# Patient Record
Sex: Male | Born: 1985 | Race: Black or African American | Hispanic: No | Marital: Single | Smoking: Never smoker
Health system: Southern US, Community
[De-identification: ages and names within clinical notes are randomized; demographics above are authoritative.]

---
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
## 2004-01-15 ENCOUNTER — Emergency Department (HOSPITAL_COMMUNITY): Admission: AD | Admit: 2004-01-15 | Discharge: 2004-01-16 | Payer: Self-pay | Admitting: Emergency Medicine

## 2006-04-17 ENCOUNTER — Emergency Department (HOSPITAL_COMMUNITY): Admission: EM | Admit: 2006-04-17 | Discharge: 2006-04-17 | Payer: Self-pay | Admitting: Emergency Medicine

## 2006-07-20 ENCOUNTER — Ambulatory Visit (HOSPITAL_COMMUNITY): Admission: RE | Admit: 2006-07-20 | Discharge: 2006-07-20 | Payer: Self-pay | Admitting: Internal Medicine

## 2006-07-20 ENCOUNTER — Ambulatory Visit: Payer: Self-pay | Admitting: Internal Medicine

## 2006-09-15 ENCOUNTER — Ambulatory Visit: Payer: Self-pay | Admitting: Internal Medicine

## 2006-09-15 ENCOUNTER — Ambulatory Visit (HOSPITAL_COMMUNITY): Admission: RE | Admit: 2006-09-15 | Discharge: 2006-09-15 | Payer: Self-pay | Admitting: Internal Medicine

## 2006-09-15 ENCOUNTER — Ambulatory Visit: Payer: Self-pay | Admitting: *Deleted

## 2006-09-22 ENCOUNTER — Ambulatory Visit: Payer: Self-pay | Admitting: Family Medicine

## 2006-11-03 ENCOUNTER — Emergency Department (HOSPITAL_COMMUNITY): Admission: EM | Admit: 2006-11-03 | Discharge: 2006-11-03 | Payer: Self-pay | Admitting: Emergency Medicine

## 2007-07-22 ENCOUNTER — Emergency Department (HOSPITAL_COMMUNITY): Admission: EM | Admit: 2007-07-22 | Discharge: 2007-07-22 | Payer: Self-pay | Admitting: Emergency Medicine

## 2007-08-18 ENCOUNTER — Encounter (INDEPENDENT_AMBULATORY_CARE_PROVIDER_SITE_OTHER): Payer: Self-pay | Admitting: *Deleted

## 2008-02-24 ENCOUNTER — Emergency Department (HOSPITAL_COMMUNITY): Admission: EM | Admit: 2008-02-24 | Discharge: 2008-02-24 | Payer: Self-pay | Admitting: Emergency Medicine

## 2008-04-02 ENCOUNTER — Emergency Department (HOSPITAL_COMMUNITY): Admission: EM | Admit: 2008-04-02 | Discharge: 2008-04-02 | Payer: Self-pay | Admitting: Emergency Medicine

## 2008-08-30 ENCOUNTER — Emergency Department (HOSPITAL_COMMUNITY): Admission: EM | Admit: 2008-08-30 | Discharge: 2008-08-30 | Payer: Self-pay | Admitting: Family Medicine

## 2008-12-05 ENCOUNTER — Emergency Department (HOSPITAL_COMMUNITY): Admission: EM | Admit: 2008-12-05 | Discharge: 2008-12-05 | Payer: Self-pay | Admitting: Family Medicine

## 2009-01-19 ENCOUNTER — Emergency Department (HOSPITAL_COMMUNITY): Admission: EM | Admit: 2009-01-19 | Discharge: 2009-01-19 | Payer: Self-pay | Admitting: Emergency Medicine

## 2009-02-14 ENCOUNTER — Emergency Department (HOSPITAL_COMMUNITY): Admission: EM | Admit: 2009-02-14 | Discharge: 2009-02-14 | Payer: Self-pay | Admitting: Family Medicine

## 2009-04-10 ENCOUNTER — Emergency Department (HOSPITAL_COMMUNITY): Admission: EM | Admit: 2009-04-10 | Discharge: 2009-04-10 | Payer: Self-pay | Admitting: Family Medicine

## 2009-04-30 ENCOUNTER — Emergency Department (HOSPITAL_COMMUNITY): Admission: EM | Admit: 2009-04-30 | Discharge: 2009-04-30 | Payer: Self-pay | Admitting: Emergency Medicine

## 2009-06-08 ENCOUNTER — Emergency Department (HOSPITAL_COMMUNITY): Admission: EM | Admit: 2009-06-08 | Discharge: 2009-06-08 | Payer: Self-pay | Admitting: Emergency Medicine

## 2009-07-26 ENCOUNTER — Emergency Department (HOSPITAL_COMMUNITY): Admission: EM | Admit: 2009-07-26 | Discharge: 2009-07-26 | Payer: Self-pay | Admitting: Family Medicine

## 2009-12-10 ENCOUNTER — Emergency Department (HOSPITAL_COMMUNITY): Admission: EM | Admit: 2009-12-10 | Discharge: 2009-12-10 | Payer: Self-pay | Admitting: Emergency Medicine

## 2010-03-05 ENCOUNTER — Emergency Department (HOSPITAL_COMMUNITY): Admission: EM | Admit: 2010-03-05 | Discharge: 2010-03-05 | Payer: Self-pay | Admitting: Family Medicine

## 2010-07-29 ENCOUNTER — Emergency Department (HOSPITAL_COMMUNITY): Admission: EM | Admit: 2010-07-29 | Discharge: 2010-07-29 | Payer: Self-pay | Admitting: Family Medicine

## 2010-08-30 IMAGING — CR DG CHEST 2V
2 series · 2 of 2 positions shown · non-contrast
Comparison: 04/10/2009 and 02/24/2008.

CLINICAL DATA: Left chest and abdominal pain since yesterday.

CHEST - 2 VIEW

[w chest pa]
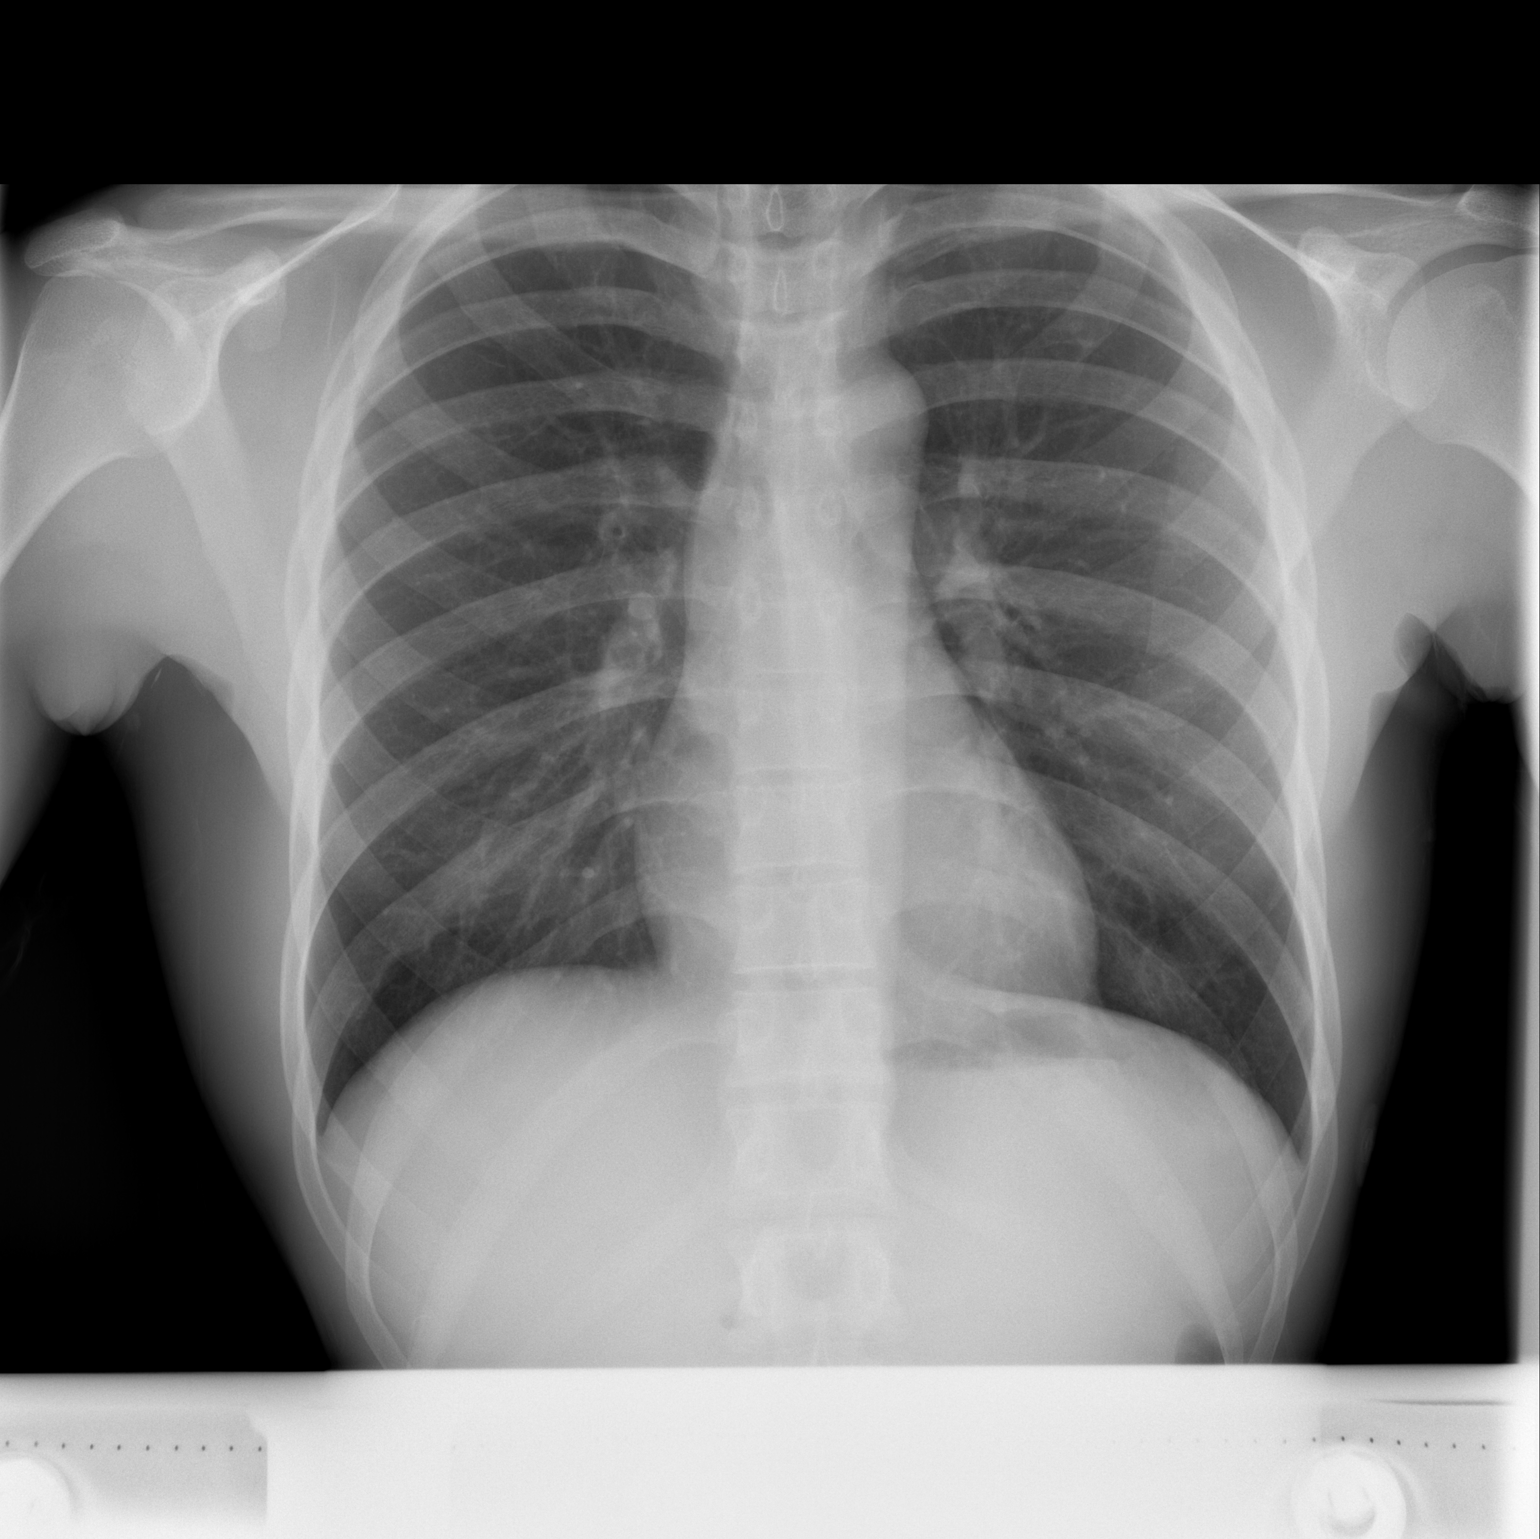

[w chest lat]
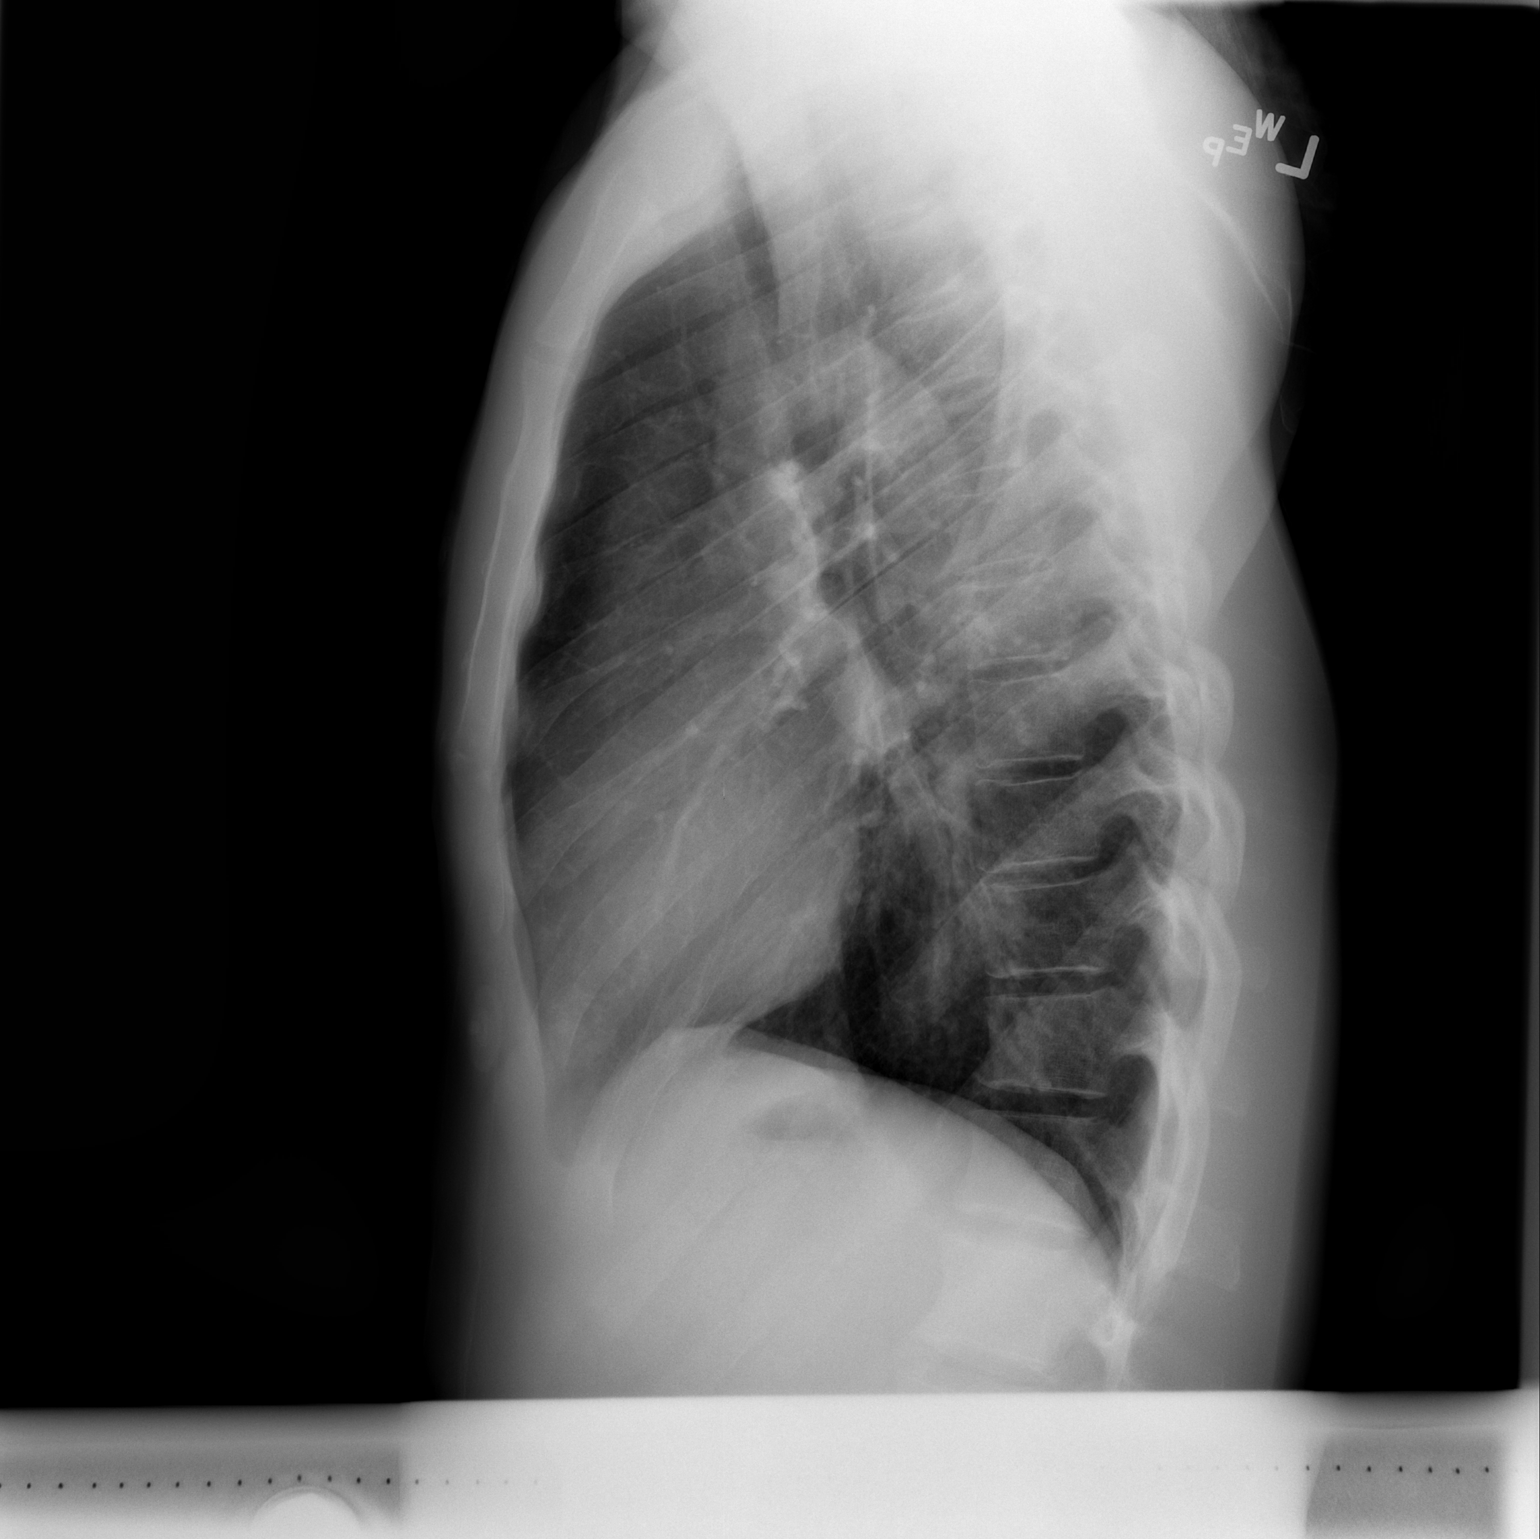

[2 of 2 positions shown; findings below may reference images not displayed]

FINDINGS: The heart size and mediastinal contours are stable.  The
lungs are clear.  There is no pleural effusion or pneumothorax.  No
acute osseous findings are demonstrated.
IMPRESSION: Stable examination.  No active cardiopulmonary process.

## 2010-10-09 ENCOUNTER — Emergency Department (HOSPITAL_COMMUNITY): Admission: EM | Admit: 2010-10-09 | Discharge: 2010-10-09 | Payer: Self-pay | Admitting: Family Medicine

## 2010-10-14 ENCOUNTER — Emergency Department (HOSPITAL_COMMUNITY): Admission: EM | Admit: 2010-10-14 | Discharge: 2010-10-14 | Payer: Self-pay | Admitting: Emergency Medicine

## 2011-02-11 LAB — POCT I-STAT, CHEM 8
BUN: 18 mg/dL (ref 6–23)
Calcium, Ion: 1.25 mmol/L (ref 1.12–1.32)
Chloride: 101 mEq/L (ref 96–112)
Creatinine, Ser: 1.2 mg/dL (ref 0.4–1.5)
Glucose, Bld: 97 mg/dL (ref 70–99)
HCT: 50 % (ref 39.0–52.0)
Hemoglobin: 17 g/dL (ref 13.0–17.0)
Potassium: 4.4 mEq/L (ref 3.5–5.1)
Sodium: 139 mEq/L (ref 135–145)
TCO2: 31 mmol/L (ref 0–100)

## 2011-02-11 LAB — POCT URINALYSIS DIPSTICK
Nitrite: NEGATIVE
Protein, ur: NEGATIVE mg/dL
Urobilinogen, UA: 0.2 mg/dL (ref 0.0–1.0)

## 2011-02-13 LAB — GC/CHLAMYDIA PROBE AMP, GENITAL
Chlamydia, DNA Probe: NEGATIVE
GC Probe Amp, Genital: NEGATIVE

## 2011-03-08 LAB — GC/CHLAMYDIA PROBE AMP, GENITAL: Chlamydia, DNA Probe: NEGATIVE

## 2011-03-09 LAB — URINALYSIS, ROUTINE W REFLEX MICROSCOPIC
Nitrite: NEGATIVE
Specific Gravity, Urine: 1.039 — ABNORMAL HIGH (ref 1.005–1.030)
pH: 6 (ref 5.0–8.0)

## 2011-03-09 LAB — DIFFERENTIAL
Lymphocytes Relative: 27 % (ref 12–46)
Lymphs Abs: 2.1 10*3/uL (ref 0.7–4.0)
Monocytes Relative: 8 % (ref 3–12)
Neutro Abs: 5.1 10*3/uL (ref 1.7–7.7)
Neutrophils Relative %: 65 % (ref 43–77)

## 2011-03-09 LAB — RAPID URINE DRUG SCREEN, HOSP PERFORMED
Amphetamines: NOT DETECTED
Barbiturates: NOT DETECTED
Benzodiazepines: NOT DETECTED
Cocaine: NOT DETECTED
Opiates: NOT DETECTED

## 2011-03-09 LAB — BASIC METABOLIC PANEL
Calcium: 8.7 mg/dL (ref 8.4–10.5)
Chloride: 101 mEq/L (ref 96–112)
Creatinine, Ser: 0.98 mg/dL (ref 0.4–1.5)
GFR calc Af Amer: >60 mL/min (ref >60)

## 2011-03-09 LAB — CBC
MCV: 79.7 fL (ref 78.0–100.0)
RBC: 5.19 MIL/uL (ref 4.22–5.81)
WBC: 7.8 10*3/uL (ref 4.0–10.5)

## 2011-03-09 LAB — ETHANOL: Alcohol, Ethyl (B): 140 mg/dL — ABNORMAL HIGH (ref 0–10)

## 2011-03-11 LAB — URINALYSIS, ROUTINE W REFLEX MICROSCOPIC
Bilirubin Urine: NEGATIVE
Glucose, UA: NEGATIVE mg/dL
Hgb urine dipstick: NEGATIVE
Nitrite: NEGATIVE
Specific Gravity, Urine: 1.033 — ABNORMAL HIGH (ref 1.005–1.030)
pH: 5.5 (ref 5.0–8.0)

## 2011-03-11 LAB — DIFFERENTIAL
Eosinophils Absolute: 0.1 10*3/uL (ref 0.0–0.7)
Eosinophils Relative: 2 % (ref 0–5)
Lymphocytes Relative: 19 % (ref 12–46)
Lymphs Abs: 1.4 10*3/uL (ref 0.7–4.0)
Monocytes Absolute: 0.8 10*3/uL (ref 0.1–1.0)
Monocytes Relative: 11 % (ref 3–12)

## 2011-03-11 LAB — RAPID URINE DRUG SCREEN, HOSP PERFORMED
Amphetamines: NOT DETECTED
Benzodiazepines: NOT DETECTED
Cocaine: NOT DETECTED
Opiates: NOT DETECTED
Tetrahydrocannabinol: NOT DETECTED

## 2011-03-11 LAB — CBC
HCT: 39.6 % (ref 39.0–52.0)
Hemoglobin: 12.9 g/dL — ABNORMAL LOW (ref 13.0–17.0)
MCHC: 32.6 g/dL (ref 30.0–36.0)
RDW: 14.6 % (ref 11.5–15.5)

## 2011-03-11 LAB — COMPREHENSIVE METABOLIC PANEL
ALT: 21 U/L (ref 0–53)
AST: 24 U/L (ref 0–37)
Albumin: 3.6 g/dL (ref 3.5–5.2)
Calcium: 9.1 mg/dL (ref 8.4–10.5)
Creatinine, Ser: 0.89 mg/dL (ref 0.4–1.5)
GFR calc Af Amer: >60 mL/min (ref >60)
Sodium: 139 mEq/L (ref 135–145)

## 2011-03-25 ENCOUNTER — Inpatient Hospital Stay (INDEPENDENT_AMBULATORY_CARE_PROVIDER_SITE_OTHER)
Admission: RE | Admit: 2011-03-25 | Discharge: 2011-03-25 | Disposition: A | Discharge status: Home / Self Care | Payer: Self-pay | Source: Ambulatory Visit | Attending: Emergency Medicine | Admitting: Emergency Medicine

## 2011-03-25 DIAGNOSIS — M940 Chondrocostal junction syndrome [Tietze]: Secondary | ICD-10-CM

## 2011-08-25 LAB — CBC
HCT: 42.9
Platelets: 334
RDW: 14.1

## 2011-08-25 LAB — COMPREHENSIVE METABOLIC PANEL
Albumin: 4.4
Alkaline Phosphatase: 72
BUN: 11
GFR calc Af Amer: >60
Potassium: 4
Total Protein: 7.1

## 2011-08-25 LAB — URINALYSIS, ROUTINE W REFLEX MICROSCOPIC
Bilirubin Urine: NEGATIVE
Glucose, UA: NEGATIVE
Ketones, ur: NEGATIVE
pH: 7.5

## 2011-08-25 LAB — POCT I-STAT, CHEM 8
BUN: 14
Calcium, Ion: 1.24
Chloride: 103
Glucose, Bld: 81

## 2011-08-25 LAB — DIFFERENTIAL
Lymphocytes Relative: 27
Monocytes Absolute: 0.6
Monocytes Relative: 7
Neutro Abs: 6.2

## 2012-04-06 ENCOUNTER — Emergency Department (HOSPITAL_COMMUNITY)
Admission: EM | Admit: 2012-04-06 | Discharge: 2012-04-06 | Disposition: A | Discharge status: Home / Self Care | Payer: Self-pay | Attending: Emergency Medicine | Admitting: Emergency Medicine

## 2012-04-06 ENCOUNTER — Encounter (HOSPITAL_COMMUNITY): Payer: Self-pay | Admitting: *Deleted

## 2012-04-06 DIAGNOSIS — K047 Periapical abscess without sinus: Secondary | ICD-10-CM | POA: Insufficient documentation

## 2012-04-06 MED ORDER — AMOXICILLIN 500 MG PO CAPS
1000.0000 mg | ORAL_CAPSULE | Freq: Once | ORAL | Status: AC
  Administered 2012-04-06: 1000 mg via ORAL
  Filled 2012-04-06: qty 2

## 2012-04-06 MED ORDER — OXYCODONE-ACETAMINOPHEN 5-325 MG PO TABS
1.0000 | ORAL_TABLET | Freq: Once | ORAL | Status: AC
  Administered 2012-04-06: 1 via ORAL
  Filled 2012-04-06: qty 1

## 2012-04-06 MED ORDER — AMOXICILLIN 500 MG PO CAPS: 1000.0000 mg | ORAL_CAPSULE | Freq: Two times a day (BID) | ORAL | Status: AC

## 2012-04-06 MED ORDER — OXYCODONE-ACETAMINOPHEN 5-325 MG PO TABS: 1.0000 | ORAL_TABLET | ORAL | Status: AC | PRN

## 2012-04-06 NOTE — ED Provider Notes (Signed)
History  This chart was scribed for Dione Booze, MD by Bennett Scrape. This patient was seen in room STRE5/STRE5 and the patient's care was started at 1:39PM.  CSN: [id number]  Arrival date & time 04/06/12  1117   First MD Initiated Contact with Patient 04/06/12 1339      Chief Complaint  Patient presents with  . Otalgia  . Sore Throat    The history is provided by the patient. No language interpreter was used.    Alan Nixon is a 26 y.o. male who presents to the Emergency Department complaining of gradual onset, gradually worsening, constant left ear ache with associated left lower jaw swelling and sore throat that started yesterday. He rates that pain a 10 out of 10 at its worst. The ear pain is worse with swallowing. He reports taking oral gel and ASA with no improvement in symptoms. He denies having a recent tooth infection. He denies fever, nausea and trouble swallowing as associated symptoms. He has no h/o chronic medical conditions.  No PCP.  History reviewed. No pertinent past medical history.  History reviewed. No pertinent past surgical history.  No family history on file.  History  Substance Use Topics  . Smoking status: Never Smoker   . Smokeless tobacco: Not on file  . Alcohol Use: Yes      Review of Systems  Constitutional: Negative for fever and chills.  HENT: Positive for ear pain, sore throat and facial swelling. Negative for dental problem.   Respiratory: Negative for shortness of breath.   Gastrointestinal: Negative for nausea and vomiting.  Neurological: Negative for weakness.    Allergies  Review of patient's allergies indicates no known allergies.  Home Medications   Current Outpatient Rx  Name Route Sig Dispense Refill  . ASPIRIN 325 MG PO TABS Oral Take 325 mg by mouth daily as needed. For pain.      Triage Vitals: BP 137/93  Pulse 77  Temp(Src) 98.9 F (37.2 C) (Oral)  Resp 16  SpO2 100%  Physical Exam  Nursing note and  vitals reviewed. Constitutional: He is oriented to person, place, and time. He appears well-developed and well-nourished. No distress.  HENT:  Head: Normocephalic and atraumatic.       Mild swelling to the left lower jaw, mild swelling and pallor of ginga around tooth number 18, mild swelling, pallor and tenderness of the ginga around tooth number 8  Eyes: EOM are normal.  Neck: Neck supple. No tracheal deviation present.  Cardiovascular: Normal rate.   Pulmonary/Chest: Effort normal. No respiratory distress.  Musculoskeletal: Normal range of motion.  Neurological: He is alert and oriented to person, place, and time.  Skin: Skin is warm and dry.  Psychiatric: He has a normal mood and affect. His behavior is normal.    ED Course  Procedures (including critical care time)  DIAGNOSTIC STUDIES: Oxygen Saturation is 100% on room air, normal by my interpretation.    COORDINATION OF CARE: 1:44PM-Discussed abscess as probable cause of pain and referral to Dr. Melynda Ripple, dentist, for pt to follow up with. Discussed antibiotics and pain medications as discharge plan with pt and pt agreed.     1. Dental abscess       MDM  Dental abscess. He'll be given a prescription for antibiotics and referred to the on-call dentist.  I personally performed the services described in this documentation, which was scribed in my presence. The recorded information has been reviewed and considered.  Dione Booze, MD 04/11/12 [phone]

## 2012-04-06 NOTE — ED Notes (Signed)
Left jaw pain ear pain and sorethroat since yesterday

## 2012-04-06 NOTE — Discharge Instructions (Signed)
Called a dentist today to set up an appointment as soon as possible. Make sure to tell the dentist that you were referred from the emergency department and make sure to bring your discharge paperwork with you when you go to see him.  Dental Abscess A dental abscess usually starts from an infected tooth. Antibiotic medicine and pain pills can be helpful, but dental infections require the attention of a dentist. Rinse around the infected area often with salt water (a pinch of salt in 8 oz of warm water). Do not apply heat to the outside of your face. See your dentist or oral surgeon as soon as possible.  SEEK IMMEDIATE MEDICAL CARE IF:  You have increasing, severe pain that is not relieved by medicine.   You or your child has an oral temperature above 102 F (38.9 C), not controlled by medicine.   Your baby is older than 3 months with a rectal temperature of 102 F (38.9 C) or higher.   Your baby is 5 months old or younger with a rectal temperature of 100.4 F (38 C) or higher.   You develop chills, severe headache, difficulty breathing, or trouble swallowing.   You have swelling in the neck or around the eye.  Document Released: 11/17/2005 Document Revised: 11/06/2011 Document Reviewed: 04/28/2007 [facility] Patient Information 2012 [place], [place].  Amoxicillin capsules or tablets What is this medicine? AMOXICILLIN (a mox i SIL in) is a penicillin antibiotic. It is used to treat certain kinds of bacterial infections. It will not work for colds, flu, or other viral infections. This medicine may be used for other purposes; ask your health care provider or pharmacist if you have questions. What should I tell my health care provider before I take this medicine? They need to know if you have any of these conditions: -asthma -kidney disease -an unusual or allergic reaction to amoxicillin, other penicillins, cephalosporin antibiotics, other medicines, foods, dyes, or preservatives -pregnant or  trying to get pregnant -breast-feeding How should I use this medicine? Take this medicine by mouth with a glass of water. Follow the directions on your prescription label. You may take this medicine with food or on an empty stomach. Take your medicine at regular intervals. Do not take your medicine more often than directed. Take all of your medicine as directed even if you think your are better. Do not skip doses or stop your medicine early. Talk to your pediatrician regarding the use of this medicine in children. While this drug may be prescribed for selected conditions, precautions do apply. Overdosage: If you think you have taken too much of this medicine contact a poison control center or emergency room at once. NOTE: This medicine is only for you. Do not share this medicine with others. What if I miss a dose? If you miss a dose, take it as soon as you can. If it is almost time for your next dose, take only that dose. Do not take double or extra doses. What may interact with this medicine? -amiloride -birth control pills -chloramphenicol -macrolides -probenecid -sulfonamides -tetracyclines This list may not describe all possible interactions. Give your health care provider a list of all the medicines, herbs, non-prescription drugs, or dietary supplements you use. Also tell them if you smoke, drink alcohol, or use illegal drugs. Some items may interact with your medicine. What should I watch for while using this medicine? Tell your doctor or health care professional if your symptoms do not improve in 2 or 3 days. Take  all of the doses of your medicine as directed. Do not skip doses or stop your medicine early. If you are diabetic, you may get a false positive result for sugar in your urine with certain brands of urine tests. Check with your doctor. Do not treat diarrhea with over-the-counter products. Contact your doctor if you have diarrhea that lasts more than 2 days or if the diarrhea is  severe and watery. What side effects may I notice from receiving this medicine? Side effects that you should report to your doctor or health care professional as soon as possible: -allergic reactions like skin rash, itching or hives, swelling of the face, lips, or tongue -breathing problems -dark urine -redness, blistering, peeling or loosening of the skin, including inside the mouth -seizures -severe or watery diarrhea -trouble passing urine or change in the amount of urine -unusual bleeding or bruising -unusually weak or tired -yellowing of the eyes or skin Side effects that usually do not require medical attention (report to your doctor or health care professional if they continue or are bothersome): -dizziness -headache -stomach upset -trouble sleeping This list may not describe all possible side effects. Call your doctor for medical advice about side effects. You may report side effects to FDA at 1-800-FDA-1088. Where should I keep my medicine? Keep out of the reach of children. Store between 68 and 77 degrees F (20 and 25 degrees C). Keep bottle closed tightly. Throw away any unused medicine after the expiration date. NOTE: This sheet is a summary. It may not cover all possible information. If you have questions about this medicine, talk to your doctor, pharmacist, or health care provider.  2012, Elsevier/Gold Standard. (02/08/2008 2:10:59 PM)  Acetaminophen; Oxycodone tablets What is this medicine? ACETAMINOPHEN; OXYCODONE (a set a MEE noe fen; ox i KOE done) is a pain reliever. It is used to treat mild to moderate pain. This medicine may be used for other purposes; ask your health care provider or pharmacist if you have questions. What should I tell my health care provider before I take this medicine? They need to know if you have any of these conditions: -brain tumor -Crohn's disease, inflammatory bowel disease, or ulcerative colitis -drink more than 3 alcohol containing  drinks per day -drug abuse or addiction -head injury -heart or circulation problems -kidney disease or problems going to the bathroom -liver disease -lung disease, asthma, or breathing problems -an unusual or allergic reaction to acetaminophen, oxycodone, other opioid analgesics, other medicines, foods, dyes, or preservatives -pregnant or trying to get pregnant -breast-feeding How should I use this medicine? Take this medicine by mouth with a full glass of water. Follow the directions on the prescription label. Take your medicine at regular intervals. Do not take your medicine more often than directed. Talk to your pediatrician regarding the use of this medicine in children. Special care may be needed. Patients over 50 years old may have a stronger reaction and need a smaller dose. Overdosage: If you think you have taken too much of this medicine contact a poison control center or emergency room at once. NOTE: This medicine is only for you. Do not share this medicine with others. What if I miss a dose? If you miss a dose, take it as soon as you can. If it is almost time for your next dose, take only that dose. Do not take double or extra doses. What may interact with this medicine? -alcohol or medicines that contain alcohol -antihistamines -barbiturates like amobarbital, butalbital, butabarbital, methohexital,  pentobarbital, phenobarbital, thiopental, and secobarbital -benztropine -drugs for bladder problems like solifenacin, trospium, oxybutynin, tolterodine, hyoscyamine, and methscopolamine -drugs for breathing problems like ipratropium and tiotropium -drugs for certain stomach or intestine problems like propantheline, homatropine methylbromide, glycopyrrolate, atropine, belladonna, and dicyclomine -general anesthetics like etomidate, ketamine, nitrous oxide, propofol, desflurane, enflurane, halothane, isoflurane, and sevoflurane -medicines for depression, anxiety, or psychotic  disturbances -medicines for pain like codeine, morphine, pentazocine, buprenorphine, butorphanol, nalbuphine, tramadol, and propoxyphene -medicines for sleep -muscle relaxants -naltrexone -phenothiazines like perphenazine, thioridazine, chlorpromazine, mesoridazine, fluphenazine, prochlorperazine, promazine, and trifluoperazine -scopolamine -trihexyphenidyl This list may not describe all possible interactions. Give your health care provider a list of all the medicines, herbs, non-prescription drugs, or dietary supplements you use. Also tell them if you smoke, drink alcohol, or use illegal drugs. Some items may interact with your medicine. What should I watch for while using this medicine? Tell your doctor or health care professional if your pain does not go away, if it gets worse, or if you have new or a different type of pain. You may develop tolerance to the medicine. Tolerance means that you will need a higher dose of the medication for pain relief. Tolerance is normal and is expected if you take this medicine for a long time. Do not suddenly stop taking your medicine because you may develop a severe reaction. Your body becomes used to the medicine. This does NOT mean you are addicted. Addiction is a behavior related to getting and using a drug for a nonmedical reason. If you have pain, you have a medical reason to take pain medicine. Your doctor will tell you how much medicine to take. If your doctor wants you to stop the medicine, the dose will be slowly lowered over time to avoid any side effects. You may get drowsy or dizzy. Do not drive, use machinery, or do anything that needs mental alertness until you know how this medicine affects you. Do not stand or sit up quickly, especially if you are an older patient. This reduces the risk of dizzy or fainting spells. Alcohol may interfere with the effect of this medicine. Avoid alcoholic drinks. The medicine will cause constipation. Try to have a bowel  movement at least every 2 to 3 days. If you do not have a bowel movement for 3 days, call your doctor or health care professional. Do not take Tylenol (acetaminophen) or medicines that have acetaminophen with this medicine. Too much acetaminophen can be very dangerous. Many nonprescription medicines contain acetaminophen. Always read the labels carefully to avoid taking more acetaminophen. What side effects may I notice from receiving this medicine? Side effects that you should report to your doctor or health care professional as soon as possible: -allergic reactions like skin rash, itching or hives, swelling of the face, lips, or tongue -breathing difficulties, wheezing -confusion -light headedness or fainting spells -severe stomach pain -yellowing of the skin or the whites of the eyes Side effects that usually do not require medical attention (report to your doctor or health care professional if they continue or are bothersome): -dizziness -drowsiness -nausea -vomiting This list may not describe all possible side effects. Call your doctor for medical advice about side effects. You may report side effects to FDA at 1-800-FDA-1088. Where should I keep my medicine? Keep out of the reach of children. This medicine can be abused. Keep your medicine in a safe place to protect it from theft. Do not share this medicine with anyone. Selling or giving away this medicine is  dangerous and against the law. Store at room temperature between 20 and 25 degrees C (68 and 77 degrees F). Keep container tightly closed. Protect from light. Flush any unused medicines down the toilet. Do not use the medicine after the expiration date. NOTE: This sheet is a summary. It may not cover all possible information. If you have questions about this medicine, talk to your doctor, pharmacist, or health care provider.  2012, Elsevier/Gold Standard. (10/16/2008 10:01:21 AM)

## 2012-04-06 NOTE — ED Notes (Signed)
Patient with complaints of sore throat, ear pain on the left side and swelling in his mouth on the lower left side since yesterday

## 2015-11-16 ENCOUNTER — Encounter (HOSPITAL_COMMUNITY): Payer: Self-pay | Admitting: Emergency Medicine

## 2015-11-16 ENCOUNTER — Emergency Department (HOSPITAL_COMMUNITY): Payer: Self-pay

## 2015-11-16 ENCOUNTER — Emergency Department (HOSPITAL_COMMUNITY)
Admission: EM | Admit: 2015-11-16 | Discharge: 2015-11-16 | Disposition: A | Discharge status: Home / Self Care | Payer: Self-pay | Attending: Emergency Medicine | Admitting: Emergency Medicine

## 2015-11-16 DIAGNOSIS — R634 Abnormal weight loss: Secondary | ICD-10-CM | POA: Insufficient documentation

## 2015-11-16 DIAGNOSIS — R0789 Other chest pain: Secondary | ICD-10-CM

## 2015-11-16 DIAGNOSIS — R630 Anorexia: Secondary | ICD-10-CM | POA: Insufficient documentation

## 2015-11-16 DIAGNOSIS — R110 Nausea: Secondary | ICD-10-CM | POA: Insufficient documentation

## 2015-11-16 DIAGNOSIS — R079 Chest pain, unspecified: Secondary | ICD-10-CM | POA: Insufficient documentation

## 2015-11-16 LAB — URINE MICROSCOPIC-ADD ON: RBC / HPF: NONE SEEN RBC/hpf (ref 0–5)

## 2015-11-16 LAB — BASIC METABOLIC PANEL
Anion gap: 8 (ref 5–15)
BUN: 12 mg/dL (ref 6–20)
CALCIUM: 9.7 mg/dL (ref 8.9–10.3)
CO2: 30 mmol/L (ref 22–32)
CREATININE: 1.04 mg/dL (ref 0.61–1.24)
Chloride: 104 mmol/L (ref 101–111)
GFR calc Af Amer: >60 mL/min (ref >60)
Glucose, Bld: 101 mg/dL — ABNORMAL HIGH (ref 65–99)
POTASSIUM: 4 mmol/L (ref 3.5–5.1)
SODIUM: 142 mmol/L (ref 135–145)

## 2015-11-16 LAB — URINALYSIS, ROUTINE W REFLEX MICROSCOPIC
Bilirubin Urine: NEGATIVE
GLUCOSE, UA: NEGATIVE mg/dL
Hgb urine dipstick: NEGATIVE
KETONES UR: NEGATIVE mg/dL
Nitrite: NEGATIVE
PROTEIN: NEGATIVE mg/dL
Specific Gravity, Urine: 1.026 (ref 1.005–1.030)
pH: 8 (ref 5.0–8.0)

## 2015-11-16 LAB — CBC
HCT: 40.3 % (ref 39.0–52.0)
Hemoglobin: 12.9 g/dL — ABNORMAL LOW (ref 13.0–17.0)
MCH: 25.5 pg — ABNORMAL LOW (ref 26.0–34.0)
MCHC: 32 g/dL (ref 30.0–36.0)
MCV: 79.6 fL (ref 78.0–100.0)
PLATELETS: 227 10*3/uL (ref 150–400)
RBC: 5.06 MIL/uL (ref 4.22–5.81)
RDW: 14.2 % (ref 11.5–15.5)
WBC: 5.5 10*3/uL (ref 4.0–10.5)

## 2015-11-16 LAB — LIPASE, BLOOD: Lipase: 35 U/L (ref 11–51)

## 2015-11-16 LAB — I-STAT TROPONIN, ED: TROPONIN I, POC: 0 ng/mL (ref 0.00–0.08)

## 2015-11-16 LAB — RAPID HIV SCREEN (HIV 1/2 AB+AG)
HIV 1/2 Antibodies: NONREACTIVE
HIV-1 P24 ANTIGEN - HIV24: NONREACTIVE

## 2015-11-16 MED ORDER — IBUPROFEN 800 MG PO TABS: 800.0000 mg | ORAL_TABLET | Freq: Three times a day (TID) | ORAL | Status: DC

## 2015-11-16 MED ORDER — KETOROLAC TROMETHAMINE 30 MG/ML IJ SOLN
30.0000 mg | Freq: Once | INTRAMUSCULAR | Status: AC
  Administered 2015-11-16: 30 mg via INTRAMUSCULAR
  Filled 2015-11-16: qty 1

## 2015-11-16 MED ORDER — ONDANSETRON 4 MG PO TBDP: 4.0000 mg | ORAL_TABLET | Freq: Three times a day (TID) | ORAL | Status: AC | PRN

## 2015-11-16 NOTE — Discharge Instructions (Signed)
You were seen in the emergency room today for multiple symptoms. All of your testing came back normal. Please call [facility] to establish primary care for follow-up and further evaluation of your symptoms. In the meantime I will give you a prescription for Zofran for nausea and ibuprofen for pain. You may try drinking nutrition drinks like Boost or Ensure to help with your weight loss. Return to the ER for new or worsening symptoms.  Please obtain all of your results from medical records or have your doctors office obtain the results - share them with your doctor - you should be seen at your doctors office in the next 2 days. Call today to arrange your follow up. Take the medications as prescribed. Please review all of the medicines and only take them if you do not have an allergy to them. Please be aware that if you are taking birth control pills, taking other prescriptions, ESPECIALLY ANTIBIOTICS may make the birth control ineffective - if this is the case, either do not engage in sexual activity or use alternative methods of birth control such as condoms until you have finished the medicine and your family doctor says it is OK to restart them. If you are on a blood thinner such as COUMADIN, be aware that any other medicine that you take may cause the coumadin to either work too much, or not enough - you should have your coumadin level rechecked in next 7 days if this is the case.  ?  It is also a possibility that you have an allergic reaction to any of the medicines that you have been prescribed - Everybody reacts differently to medications and while MOST people have no trouble with most medicines, you may have a reaction such as nausea, vomiting, rash, swelling, shortness of breath. If this is the case, please stop taking the medicine immediately and contact your physician.  ?  You should return to the ER if you develop severe or worsening symptoms.

## 2015-11-16 NOTE — ED Notes (Signed)
Pt has c/o generalized body aches, left sided rib pain, central chest pain, also c/o weight loss of approx 12 # in 2 weeks. Has also had shaking chills -- in past 2 weeks. C/o increased in feeling tired.

## 2015-11-16 NOTE — ED Provider Notes (Signed)
CSN: [id number]     Arrival date & time 11/16/15  1216 History   First MD Initiated Contact with Patient 11/16/15 1247     Chief Complaint  Patient presents with  . Chest Pain  . Weight Loss    HPI  Alan Nixon is an 29 y.o. male with no significant PMH who presents to the ED for evaluation of left rib pain and weight loss. He states that for the past two weeks he has had intermittent left chest/left rib pain. He states it feels like his lower left ribs are about to "give out." Denies history of injury or trauma. Denies SOB. He states the pain radiates to his LUQ and his back He also reports an unintentional 12 lb weight loss over the past two weeks. Endorses lack of appetite. Endorses intermittent nausea with no emesis. Denies diarrhea. He reports he has felt low energy and fatigued for the past two weeks as well. Denies cough, congestion, fever. Denies dysuria, urinary frequency/urgency, gross hematuria. He does endorse intermittent chills. He is sexually active with one partner currently. Denies condom use. Endorses occasional EtOH. Denies tobacco, MJ, cocaine, IVDU, or other drugs.   History reviewed. No pertinent past medical history. History reviewed. No pertinent past surgical history. No family history on file. Social History  Substance Use Topics  . Smoking status: Never Smoker   . Smokeless tobacco: None  . Alcohol Use: Yes     Comment: occasionally    Review of Systems  All other systems reviewed and are negative.  Review of Systems  All other systems reviewed and are negative.    Allergies  Review of patient's allergies indicates no known allergies.  Home Medications   Prior to Admission medications   Medication Sig Start Date End Date Taking? Authorizing Provider  aspirin 325 MG tablet Take 325 mg by mouth daily as needed. For pain.    Historical Provider, MD   BP 137/87 mmHg  Pulse 73  Temp(Src) 97.9 F (36.6 C) (Oral)  Resp 16  Ht  (1.676 m)  Wt  59.376 kg  BMI 21.14 kg/m2  SpO2 99% Physical Exam  Constitutional: He is oriented to person, place, and time. No distress.  Thin, NAD  HENT:  Right Ear: External ear normal.  Left Ear: External ear normal.  Nose: Nose normal.  Mouth/Throat: Oropharynx is clear and moist and mucous membranes are normal. No oropharyngeal exudate.  Eyes: Conjunctivae and EOM are normal. Pupils are equal, round, and reactive to light. No scleral icterus.  Neck: Normal range of motion. Neck supple.  Cardiovascular: Normal rate, regular rhythm, normal heart sounds and intact distal pulses.   Pulmonary/Chest: Effort normal and breath sounds normal. No respiratory distress. He has no wheezes.    Abdominal: Soft. Bowel sounds are normal. He exhibits no distension. There is no rebound and no guarding.    +L CVA tenderness  Musculoskeletal: He exhibits no edema.  Lymphadenopathy:    He has no cervical adenopathy.  Neurological: He is alert and oriented to person, place, and time. No cranial nerve deficit. Coordination normal.  Skin: Skin is warm and dry. He is not diaphoretic.  Psychiatric: He has a normal mood and affect.  Nursing note and vitals reviewed.   ED Course  Procedures (including critical care time) Labs Review Labs Reviewed  BASIC METABOLIC PANEL - Abnormal; Notable for the following:    Glucose, Bld 101 (*)    All other components within normal limits  CBC -  Abnormal; Notable for the following:    Hemoglobin 12.9 (*)    MCH 25.5 (*)    All other components within normal limits  RAPID HIV SCREEN (HIV 1/2 AB+AG)  LIPASE, BLOOD  URINALYSIS, ROUTINE W REFLEX MICROSCOPIC (NOT AT [facility])  Rosezena SensorI-STAT TROPOININ, ED    Imaging Review Dg Chest 2 View  11/16/2015  CLINICAL DATA:  29 year old male with chest pain, weakness and weight loss for 2 weeks. EXAM: CHEST  2 VIEW COMPARISON:  07/26/2009 and prior chest radiographs dating back 02/24/2008 FINDINGS: The cardiomediastinal silhouette is  unremarkable. There is no evidence of focal airspace disease, pulmonary edema, suspicious pulmonary nodule/mass, pleural effusion, or pneumothorax. No acute bony abnormalities are identified. IMPRESSION: No active cardiopulmonary disease. Electronically Signed   By: Harmon PierJeffrey  Hu M.D.   On: 11/16/2015 13:33   I have personally reviewed and evaluated these images and lab results as part of my medical decision-making.   EKG Interpretation None      MDM   Final diagnoses:  Chest wall pain  Nausea  Decreased appetite  Weight loss    Unclear etiology. Viral syndrome vs ID/HIV vs malignancy on ddx. Will check cbc, bmp, rapid HIV, CXR. Given radiation of pain to back and CVA tenderness consider kidney stone or pyelo though pt denies urinary symptoms. Will check UA. Will check lipase as well given pain to LUQ with nausea and decreased appetite. I have low suspicion for ACS given young otherwise healthy pt but given complaint of chest pain will get i stat troponin.  Labs completely unremarkable. Pt is slightly anemic but this is his baseline. Encourage nutrition drinks for weight support. Rx given for supportive meds.  Will give referral for PCP f/u for further evaluation. ER return precautions given.   Alan CoriaSerena Y Katherine Tout, PA-C 11/16/15 2256  Melene Planan Floyd, DO 11/17/15 [phone]

## 2015-11-17 LAB — URINE CULTURE

## 2015-12-05 ENCOUNTER — Emergency Department (HOSPITAL_COMMUNITY)
Admission: EM | Admit: 2015-12-05 | Discharge: 2015-12-05 | Disposition: A | Discharge status: Home / Self Care | Payer: Self-pay | Attending: Emergency Medicine | Admitting: Emergency Medicine

## 2015-12-05 ENCOUNTER — Encounter (HOSPITAL_COMMUNITY): Payer: Self-pay | Admitting: Emergency Medicine

## 2015-12-05 DIAGNOSIS — Z7982 Long term (current) use of aspirin: Secondary | ICD-10-CM | POA: Insufficient documentation

## 2015-12-05 DIAGNOSIS — J029 Acute pharyngitis, unspecified: Secondary | ICD-10-CM | POA: Insufficient documentation

## 2015-12-05 NOTE — ED Provider Notes (Signed)
CSN: [id number]     Arrival date & time 12/05/15  1416 History  By signing my name below, I, Doreatha Martin, attest that this documentation has been prepared under the direction and in the presence of  [employer], PA-C. Electronically Signed: Doreatha Martin, ED Scribe. 12/05/2015. 2:51 PM.    Chief Complaint  Patient presents with  . Sore Throat   The history is provided by the patient. No language interpreter was used.   HPI Comments: Alan Nixon is a 30 y.o. male otherwise healthy who presents to the Emergency Department complaining of moderate sore throat onset this morning with associated cough. Pt notes that he is able to tolerate food and fluids, but swallowing increases his pain. Pt denies having any sick contacts with similar symptoms.  Pt denies taking OTC medications at home to improve symptoms.  He denies SOB, difficulty swallowing, fever.  History reviewed. No pertinent past medical history. No past surgical history on file. No family history on file. Social History  Substance Use Topics  . Smoking status: Never Smoker   . Smokeless tobacco: None  . Alcohol Use: Yes     Comment: occasionally    Review of Systems A 10 point review of systems was completed and was negative except for pertinent positives and negatives as mentioned in the history of present illness.   Allergies  Review of patient's allergies indicates no known allergies.  Home Medications   Prior to Admission medications   Medication Sig Start Date End Date Taking? Authorizing Provider  aspirin 325 MG tablet Take 325 mg by mouth daily as needed. For pain.   Yes Historical Provider, MD  ibuprofen (ADVIL,MOTRIN) 800 MG tablet Take 1 tablet (800 mg total) by mouth 3 (three) times daily. Patient not taking: Reported on 12/05/2015 11/16/15   Ace Gins Sam, PA-C  ondansetron (ZOFRAN ODT) 4 MG disintegrating tablet Take 1 tablet (4 mg total) by mouth every 8 (eight) hours as needed for nausea or  vomiting. Patient not taking: Reported on 12/05/2015 11/16/15   Ace Gins Sam, PA-C   BP 127/90 mmHg  Pulse 67  Temp(Src) 98.8 F (37.1 C) (Oral)  Resp 18  SpO2 100% Physical Exam  Constitutional: He is oriented to person, place, and time. He appears well-developed and well-nourished.  Awake, alert, nontoxic appearance.    HENT:  Head: Normocephalic and atraumatic.  Mouth/Throat: Oropharynx is clear and moist. No oropharyngeal exudate.  Oropharynx clear and moist.   Eyes: Conjunctivae and EOM are normal. Pupils are equal, round, and reactive to light.  Neck: Normal range of motion. Neck supple.  No cervical adenopathy.   Cardiovascular: Normal rate, regular rhythm and normal heart sounds.  Exam reveals no gallop and no friction rub.   No murmur heard. Heart sounds normal. RRR.    Pulmonary/Chest: Effort normal and breath sounds normal. No respiratory distress. He has no wheezes. He has no rales.  Lungs CTA bilaterally.   Abdominal: He exhibits no distension.  Musculoskeletal: Normal range of motion.  Lymphadenopathy:    He has no cervical adenopathy.  Neurological: He is alert and oriented to person, place, and time.  Skin: Skin is warm and dry.  Psychiatric: He has a normal mood and affect. His behavior is normal.  Nursing note and vitals reviewed.   ED Course  Procedures (including critical care time) DIAGNOSTIC STUDIES: Oxygen Saturation is 100% on RA, normal by my interpretation.    COORDINATION OF CARE: 2:46 PM Discussed treatment plan with pt  at bedside which includes symptomatic treatment and pt agreed to plan.    MDM   Final diagnoses:  Sore throat   Filed Vitals:   12/05/15 1422  BP: 127/90  Pulse: 67  Temp: 98.8 F (37.1 C)  Resp: 18    Meds given in ED:  Medications - No data to display  Discharge Medication List as of 12/05/2015  3:01 PM     Rapid strep not indicated. Diagnosis of viral pharyngitis. No abx indicated at this time. Discharge with  symptomatic tx. No evidence of dehydration. Pt is tolerating secretions. Presentation not concerning for peritonsillar abscess or spread of infection to deep spaces of the throat; patent airway. Specific return precautions discussed. Recommended PCP follow up. Pt appears safe for discharge.   I personally performed the services described in this documentation, which was scribed in my presence. The recorded information has been reviewed and is accurate.   Joycie PeekBenjamin Brandey Vandalen, PA-C 12/05/15 1603  Doug SouSam Jacubowitz, MD 12/05/15 [id number]

## 2015-12-05 NOTE — ED Notes (Signed)
Pt c/o sore throat since he woke up this morning.

## 2015-12-05 NOTE — Discharge Instructions (Signed)
Your sore throat is likely due to a virus. This should resolve on its own. Please take Motrin and Tylenol as needed for your discomfort. Follow-up with your doctor as needed. Return to ED for any new or worsening symptoms.  Sore Throat A sore throat is a painful, burning, sore, or scratchy feeling of the throat. There may be pain or tenderness when swallowing or talking. You may have other symptoms with a sore throat. These include coughing, sneezing, fever, or a swollen neck. A sore throat is often the first sign of another sickness. These sicknesses may include a cold, flu, strep throat, or an infection called mono. Most sore throats go away without medical treatment.  HOME CARE   Only take medicine as told by your doctor.  Drink enough fluids to keep your pee (urine) clear or pale yellow.  Rest as needed.  Try using throat sprays, lozenges, or suck on hard candy (if older than 4 years or as told).  Sip warm liquids, such as broth, herbal tea, or warm water with honey. Try sucking on frozen ice pops or drinking cold liquids.  Rinse the mouth (gargle) with salt water. Mix 1 teaspoon salt with 8 ounces of water.  Do not smoke. Avoid being around others when they are smoking.  Put a humidifier in your bedroom at night to moisten the air. You can also turn on a hot shower and sit in the bathroom for 5-10 minutes. Be sure the bathroom door is closed. GET HELP RIGHT AWAY IF:   You have trouble breathing.  You cannot swallow fluids, soft foods, or your spit (saliva).  You have more puffiness (swelling) in the throat.  Your sore throat does not get better in 7 days.  You feel sick to your stomach (nauseous) and throw up (vomit).  You have a fever or lasting symptoms for more than 2-3 days.  You have a fever and your symptoms suddenly get worse. MAKE SURE YOU:   Understand these instructions.  Will watch your condition.  Will get help right away if you are not doing well or get  worse.   This information is not intended to replace advice given to you by your health care provider. Make sure you discuss any questions you have with your health care provider.   Document Released: 08/26/2008 Document Revised: 08/11/2012 Document Reviewed: 07/25/2012 Elsevier Interactive Patient Education [employer]2016 Elsevier Inc.

## 2016-02-19 ENCOUNTER — Emergency Department (INDEPENDENT_AMBULATORY_CARE_PROVIDER_SITE_OTHER)
Admission: EM | Admit: 2016-02-19 | Discharge: 2016-02-19 | Disposition: A | Discharge status: Home / Self Care | Payer: Self-pay | Source: Home / Self Care | Attending: Family Medicine | Admitting: Family Medicine

## 2016-02-19 ENCOUNTER — Emergency Department (HOSPITAL_COMMUNITY)
Admission: EM | Admit: 2016-02-19 | Discharge: 2016-02-19 | Disposition: A | Discharge status: Home / Self Care | Payer: Self-pay

## 2016-02-19 ENCOUNTER — Encounter (HOSPITAL_COMMUNITY): Payer: Self-pay | Admitting: Emergency Medicine

## 2016-02-19 DIAGNOSIS — M76899 Other specified enthesopathies of unspecified lower limb, excluding foot: Secondary | ICD-10-CM

## 2016-02-19 DIAGNOSIS — M791 Myalgia, unspecified site: Secondary | ICD-10-CM

## 2016-02-19 DIAGNOSIS — M6580 Other synovitis and tenosynovitis, unspecified site: Secondary | ICD-10-CM

## 2016-02-19 MED ORDER — TRAMADOL HCL 50 MG PO TABS: 50.0000 mg | ORAL_TABLET | Freq: Four times a day (QID) | ORAL | Status: AC | PRN

## 2016-02-19 MED ORDER — DICLOFENAC POTASSIUM 50 MG PO TABS: 50.0000 mg | ORAL_TABLET | Freq: Three times a day (TID) | ORAL | Status: AC

## 2016-02-19 NOTE — ED Notes (Signed)
Patient came up to desk stating he was leaving

## 2016-02-19 NOTE — ED Provider Notes (Signed)
CSN: [id number]     Arrival date & time 02/19/16  1821 History   First MD Initiated Contact with Patient 02/19/16 2042     Chief Complaint  Patient presents with  . Leg Pain   (Consider location/radiation/quality/duration/timing/severity/associated sxs/prior Treatment) HPI Comments: 30 year old male complaining of thigh pain with the "in the right for about one week. He states that he has to walk to work and then after arriving to work his job requires prolonged standing. This  seems to make his pain worse. He does not know why his guarding or why his legs are hurting. The pain is worse with squatting, prolonged standing and ambulation. It is better with rest.    History reviewed. No pertinent past medical history. History reviewed. No pertinent past surgical history. No family history on file. Social History  Substance Use Topics  . Smoking status: Never Smoker   . Smokeless tobacco: None  . Alcohol Use: Yes     Comment: occasionally    Review of Systems  Constitutional: Positive for activity change. Negative for fever and fatigue.  Respiratory: Negative.   Gastrointestinal: Negative.   Musculoskeletal: Positive for myalgias. Negative for back pain and neck pain.  Skin: Negative.   Neurological: Negative.     Allergies  Review of patient's allergies indicates no known allergies.  Home Medications   Prior to Admission medications   Medication Sig Start Date End Date Taking? Authorizing Provider  aspirin 325 MG tablet Take 325 mg by mouth daily as needed. For pain.    Historical Provider, MD  diclofenac (CATAFLAM) 50 MG tablet Take 1 tablet (50 mg total) by mouth 3 (three) times daily. One tablet TID with food prn pain. 02/19/16   Hayden Rasmussenavid Camden Knotek, NP  ondansetron (ZOFRAN ODT) 4 MG disintegrating tablet Take 1 tablet (4 mg total) by mouth every 8 (eight) hours as needed for nausea or vomiting. Patient not taking: Reported on 12/05/2015 11/16/15   Ace GinsSerena Y Sam, PA-C  traMADol (ULTRAM)  50 MG tablet Take 1 tablet (50 mg total) by mouth every 6 (six) hours as needed. 02/19/16   Hayden Rasmussenavid Hannelore Bova, NP   Meds Ordered and Administered this Visit  Medications - No data to display  BP 124/80 mmHg  Pulse 63  Temp(Src) 96.9 F (36.1 C) (Oral)  SpO2 100% No data found.   Physical Exam  Constitutional: He is oriented to person, place, and time. He appears well-developed and well-nourished. No distress.  HENT:  Head: Normocephalic and atraumatic.  Eyes: EOM are normal. Left eye exhibits no discharge.  Neck: Normal range of motion. Neck supple.  Pulmonary/Chest: Effort normal. No respiratory distress.  Musculoskeletal: Normal range of motion. He exhibits tenderness.  Tenderness to the anterior and lateral aspect of the left thigh as well as the lateral knee tendons.  There is less tenderness to the right anterior thigh musculature. Bilateral knee extension is complete to 180 against resistance. Post extension produces pain and soreness to the anterior thighs bilaterally. Distal neurovascular motor sensory is intact. Normal warmth and color.  Neurological: He is alert and oriented to person, place, and time. No cranial nerve deficit.  Skin: Skin is warm and dry.  Psychiatric: He has a normal mood and affect.  Nursing note and vitals reviewed.   ED Course  Procedures (including critical care time)  Labs Review Labs Reviewed - No data to display  Imaging Review No results found.   Visual Acuity Review  Right Eye Distance:   Left Eye Distance:  Bilateral Distance:    Right Eye Near:   Left Eye Near:    Bilateral Near:         MDM   1. Myalgia   2. Pain in the muscles   3. Knee tendinitis    Muscle pain and soreness in the thighs due to excessive walking and prolonged standing. Ice to the muscles and tendons of the lateral aspect of the nasal first couple days. Continue to use ice to the knees as needed then switched to heat for the muscles of the thighs. Limit  walking and prolonged standing for the next few days and as much as she can continue to feel better. Then when starting back perform quadriceps stretches as demonstrated. Perform these before during and after ambulation and standing. Meds ordered this encounter  Medications  . diclofenac (CATAFLAM) 50 MG tablet    Sig: Take 1 tablet (50 mg total) by mouth 3 (three) times daily. One tablet TID with food prn pain.    Dispense:  21 tablet    Refill:  0    Order Specific Question:  Supervising Provider    Answer:  Linna Hoff [phone]  . traMADol (ULTRAM) 50 MG tablet    Sig: Take 1 tablet (50 mg total) by mouth every 6 (six) hours as needed.    Dispense:  15 tablet    Refill:  0    Order Specific Question:  Supervising Provider    Answer:  Linna Hoff [[id number]]       Hayden Rasmussen, NP 02/19/16 2108

## 2016-02-19 NOTE — Discharge Instructions (Signed)
Muscle Pain, Adult °Muscle pain (myalgia) may be caused by many things, including: °· Overuse or muscle strain, especially if you are not in shape. This is the most common cause of muscle pain. °· Injury. °· Bruises. °· Viruses, such as the flu. °· Infectious diseases. °· Fibromyalgia, which is a chronic condition that causes muscle tenderness, fatigue, and headache. °· Autoimmune diseases, including lupus. °· Certain drugs, including ACE inhibitors and statins. °Muscle pain may be mild or severe. In most cases, the pain lasts only a short time and goes away without treatment. To diagnose the cause of your muscle pain, your health care provider will take your medical history. This means he or she will ask you when your muscle pain began and what has been happening. If you have not had muscle pain for very long, your health care provider may want to wait before doing much testing. If your muscle pain has lasted a long time, your health care provider may want to run tests right away. If your health care provider thinks your muscle pain may be caused by illness, you may need to have additional tests to rule out certain conditions.  °Treatment for muscle pain depends on the cause. Home care is often enough to relieve muscle pain. Your health care provider may also prescribe anti-inflammatory medicine. °HOME CARE INSTRUCTIONS °Watch your condition for any changes. The following actions may help to lessen any discomfort you are feeling: °· Only take over-the-counter or prescription medicines as directed by your health care provider. °· Apply ice to the sore muscle: °· Put ice in a plastic bag. °· Place a towel between your skin and the bag. °· Leave the ice on for 15-20 minutes, 3-4 times a day. °· You may alternate applying hot and cold packs to the muscle as directed by your health care provider. °· If overuse is causing your muscle pain, slow down your activities until the pain goes away. °· Remember that it is normal  to feel some muscle pain after starting a workout program. Muscles that have not been used often will be sore at first. °· Do regular, gentle exercises if you are not usually active. °· Warm up before exercising to lower your risk of muscle pain. °· Do not continue working out if the pain is very bad. Bad pain could mean you have injured a muscle. °SEEK MEDICAL CARE IF: °· Your muscle pain gets worse, and medicines do not help. °· You have muscle pain that lasts longer than 3 days. °· You have a rash or fever along with muscle pain. °· You have muscle pain after a tick bite. °· You have muscle pain while working out, even though you are in good physical condition. °· You have redness, soreness, or swelling along with muscle pain. °· You have muscle pain after starting a new medicine or changing the dose of a medicine. °SEEK IMMEDIATE MEDICAL CARE IF: °· You have trouble breathing. °· You have trouble swallowing. °· You have muscle pain along with a stiff neck, fever, and vomiting. °· You have severe muscle weakness or cannot move part of your body. °MAKE SURE YOU:  °· Understand these instructions. °· Will watch your condition. °· Will get help right away if you are not doing well or get worse. °  °This information is not intended to replace advice given to you by your health care provider. Make sure you discuss any questions you have with your health care provider. °  °Document Released:   10/09/2006 Document Revised: 12/08/2014 Document Reviewed: 09/13/2013 Elsevier Interactive Patient Education 2016 Elsevier Inc.  Tendinitis and Tenosynovitis  Tendinitis is inflammation of the tendon. Tenosynovitis is inflammation of the lining around the tendon (tendon sheath). These painful conditions often occur at once. Tendons attach muscle to bone. To move a limb, force from the muscle moves through the tendon, to the bone. These conditions often cause increased pain when moving. Tendinitis may be caused by a small or  partial tear in the tendon.  SYMPTOMS   Pain, tenderness, redness, bruising, or swelling at the injury.  Loss of normal joint movement.  Pain that gets worse with use of the muscle and joint attached to the tendon.  Weakness in the tendon, caused by calcium build up that may occur with tendinitis.  Commonly affected tendons:  Achilles tendon (calf of leg).  Rotator cuff (shoulder joint).  Patellar tendon (kneecap to shin).  Peroneal tendon (ankle).  Posterior tibial tendon (inner ankle).  Biceps tendon (in front of shoulder). CAUSES   Sudden strain on a flexed muscle, muscle overuse, sudden increase or change in activity, vigorous activity.  Result of a direct hit (less common).  Poor muscle action (biomechanics). RISK INCREASES WITH:  Injury (trauma).  Too much exercise.  Sudden change in athletic activity.  Incorrect exercise form or technique.  Poor strength and flexibility.  Not warming-up properly before activity.  Returning to activity before healing is complete. PREVENTION   Warm-up and stretch properly before activity.  Maintain physical fitness:  Joint flexibility.  Muscle strength and endurance.  Fitness that increases heart rate.  Learn and use proper exercise techniques.  Use rehabilitation exercises to strengthen weak muscles and tendons.  Ice the tendon after activity, to reduce recurring inflammation.  Wear proper fitting protective equipment for specific tendons, when indicated. PROGNOSIS  When treated properly, can be cured in 6 to 8 weeks. Recovery may take longer, depending on degree of injury.  RELATED COMPLICATIONS   Re-injury or recurring symptoms.  Permanent weakness or joint stiffness, if injury is severe and recovery is not completed.  Delayed healing, if sports are started before healing is complete.  Tearing apart (rupture) of the inflamed tendon. Tendinitis means the tendon is injured and must recover. TREATMENT    Treatment first involves ice, medicine, and rest from aggravating activities. This reduces pain and inflammation. Modifying your activity may be considered to prevent recurring injury. A brace, elastic bandage wrap, splint, cast, or sling may be prescribed to protect the joint for a short period. After that period, strengthening and stretching exercise may help to regain strength and full range of motion. If the condition persists, despite non-surgical treatment, surgery may be recommended to remove the inflamed tendon lining. Corticosteroid injections may be given to reduce inflammation. However, these injections may weaken the tendon and increase your risk for tendon rupture. MEDICATION   If pain medicine is needed, nonsteroidal anti-inflammatory medicines (aspirin and ibuprofen), or other minor pain relievers (acetaminophen), are often recommended.  Do not take pain medicine for 7 days before surgery.  Prescription pain relievers are usually prescribed only after surgery. Use only as directed and only as much as you need.  Ointments applied to the skin may be helpful.  Corticosteroid injections may be given to reduce inflammation. However, this may increase your risk of a tendon rupture. HEAT AND COLD  Cold treatment (icing) relieves pain and reduces inflammation. Cold treatment should be applied for 10 to 15 minutes every 2 to 3 hours,  and immediately after activity that aggravates your symptoms. Use ice packs or an ice massage.  Heat treatment may be used before performing stretching and strengthening activities prescribed by your caregiver, physical therapist, or athletic trainer. Use a heat pack or a warm water soak. SEEK MEDICAL CARE IF:   Symptoms get worse or do not improve, despite treatment.  Pain becomes too much to tolerate.  You develop numbness or tingling.  Toes become cold, or toenails become blue, gray, or dark colored.  New, unexplained symptoms develop. (Drugs used  in treatment may produce side effects.)   This information is not intended to replace advice given to you by your health care provider. Make sure you discuss any questions you have with your health care provider.   Document Released: 11/17/2005 Document Revised: 02/09/2012 Document Reviewed: 03/01/2009 Elsevier Interactive Patient Education [employer] Inc[employer]

## 2016-02-19 NOTE — ED Notes (Signed)
No answer lobby 

## 2016-02-19 NOTE — ED Notes (Signed)
Here with 1 week progressive sharp, throb bilateral leg pain  Denies cramps or strain Worsening pain in the last 2 days Ibuprofen not working

## 2017-06-17 ENCOUNTER — Ambulatory Visit (HOSPITAL_COMMUNITY)
Admission: EM | Admit: 2017-06-17 | Discharge: 2017-06-17 | Disposition: A | Discharge status: Home / Self Care | Payer: Self-pay | Attending: Physician Assistant | Admitting: Physician Assistant

## 2017-06-17 ENCOUNTER — Encounter (HOSPITAL_COMMUNITY): Payer: Self-pay | Admitting: Family Medicine

## 2017-06-17 DIAGNOSIS — R0789 Other chest pain: Secondary | ICD-10-CM

## 2017-06-17 MED ORDER — NAPROXEN 500 MG PO TABS: 500.0000 mg | ORAL_TABLET | Freq: Two times a day (BID) | ORAL | 0 refills | Status: AC

## 2017-06-17 NOTE — ED Triage Notes (Signed)
Pt here for left sided chest pain and left rib pain x 4 days. Denies injury or heavy lifting. Denies SOB. sts hurts with moving and breathing.

## 2017-06-17 NOTE — ED Provider Notes (Signed)
CSN: [id number]     Arrival date & time 06/17/17  1049 History   None    Chief Complaint  Patient presents with  . Chest Pain   (Consider location/radiation/quality/duration/timing/severity/associated sxs/prior Treatment) 31 year old male comes in with a four-day history of left sided chest pain and rib pain. He denies any injury. States the chest pain comes and goes, describes it as a stabbing pain. States the rib pain is constant, stabbing. States he has associated weakness, shortness of breath when the chest pain comes on. 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 He has tried some ibuprofen with small relief. Patient denies personal or family history of cardiac disease, denies cocaine use. Denies URI symptoms such as fever, cough, congestion, wheezing. Pain is worse when he is lying down specifically on the left side. Pain can also be worse with certain movement. Pain is not specifically worse with activity, and does not improve with rest.      History reviewed. No pertinent past medical history. History reviewed. No pertinent surgical history. History reviewed. No pertinent family history. Social History  Substance Use Topics  . Smoking status: Never Smoker  . Smokeless tobacco: Not on file  . Alcohol use Yes     Comment: occasionally    Review of Systems  Constitutional: Negative for chills, diaphoresis and fever.  HENT: Negative for congestion, rhinorrhea and sore throat.   Respiratory: Positive for shortness of breath. Negative for cough and wheezing.   Cardiovascular: Positive for chest pain. Negative for palpitations and leg swelling.  Musculoskeletal: Positive for myalgias. Negative for arthralgias.  Neurological: Positive for dizziness and weakness. Negative for tremors, syncope, light-headedness and headaches.    Allergies  Patient has no known allergies.  Home Medications   Prior to Admission medications    Medication Sig Start Date End Date Taking? Authorizing Provider  aspirin 325 MG tablet Take 325 mg by mouth daily as needed. For pain.    [provider]  diclofenac (CATAFLAM) 50 MG tablet Take 1 tablet (50 mg total) by mouth 3 (three) times daily. One tablet TID with food prn pain. 02/19/16   Hayden RasmussenMabe, David, NP  naproxen (NAPROSYN) 500 MG tablet Take 1 tablet (500 mg total) by mouth 2 (two) times daily. 06/17/17 06/27/17  Cathie HoopsYu, Belva Koziel V, PA-C  ondansetron (ZOFRAN ODT) 4 MG disintegrating tablet Take 1 tablet (4 mg total) by mouth every 8 (eight) hours as needed for nausea or vomiting. Patient not taking: Reported on 12/05/2015 11/16/15   Sam, Ace GinsSerena Y, PA-C  traMADol (ULTRAM) 50 MG tablet Take 1 tablet (50 mg total) by mouth every 6 (six) hours as needed. 02/19/16   Hayden RasmussenMabe, David, NP   Meds Ordered and Administered this Visit  Medications - No data to display  BP 130/81   Pulse (!) 52   Temp 98.1 F (36.7 C)   Resp 18   SpO2 100%  No data found.   Physical Exam  Constitutional: He is oriented to person, place, and time. He appears well-developed and well-nourished. No distress.  HENT:  Head: Normocephalic and atraumatic.  Eyes: Pupils are equal, round, and reactive to light. Conjunctivae are normal.  Cardiovascular: Normal rate, regular rhythm and normal heart sounds.  Exam reveals no gallop and no friction rub.   No murmur heard. Pulmonary/Chest: Effort normal and breath sounds normal. He has no decreased breath sounds. He has no wheezes. He has no rhonchi. He has no rales.  Tenderness on palpation of the left chest, max point of tenderness at the shoulder area. Tenderness on palpation of the left side around 5th-6th rib.   Musculoskeletal:  Full ROM. Strength normal and equal bilaterally. Sensation intact and equal bilaterally.   Neurological: He is alert and oriented to person, place, and time.  Skin: Skin is warm and dry.  Psychiatric: He has a normal mood and affect. His behavior  is normal. Judgment normal.    Urgent Care Course     Procedures (including critical care time)  Labs Review Labs Reviewed - No data to display  Imaging Review No results found.      MDM   1. Chest wall pain    Discussed with patient history and exam most consistent with muscle strain. Start naproxen 500mg  BID x 10 days. Ice/heat compress as needed. Given patient symptoms inconsistent with angina, no personal or family history of cardiac disease, no history of drug use, low suspicion for cardiac causes. Patient to monitor for worsening of symptoms, weakness, lightheadedness, shortness of breath with chest pain, to go to the ED immediately.    Belinda Fisher, PA-C 06/17/17 1827

## 2017-06-17 NOTE — Discharge Instructions (Signed)
Your symptoms are most consistent with a muscle strain, start naproxen 500mg  twice a day for 10 days. Ice/heat compress as needed. This can take up to 2-3 weeks to resolve completely, but you should be feeling better each week. If you are having worsening of symptoms, weakness, lightheadedness, shortness of breath with the chest pain, go to the emergency department immediately.

## 2017-07-22 ENCOUNTER — Ambulatory Visit (INDEPENDENT_AMBULATORY_CARE_PROVIDER_SITE_OTHER): Payer: Self-pay

## 2017-07-22 ENCOUNTER — Ambulatory Visit (HOSPITAL_COMMUNITY)
Admission: EM | Admit: 2017-07-22 | Discharge: 2017-07-22 | Disposition: A | Discharge status: Home / Self Care | Payer: Self-pay | Attending: Family Medicine | Admitting: Family Medicine

## 2017-07-22 ENCOUNTER — Encounter (HOSPITAL_COMMUNITY): Payer: Self-pay | Admitting: Emergency Medicine

## 2017-07-22 DIAGNOSIS — R0789 Other chest pain: Secondary | ICD-10-CM

## 2017-07-22 DIAGNOSIS — M940 Chondrocostal junction syndrome [Tietze]: Secondary | ICD-10-CM

## 2017-07-22 DIAGNOSIS — R0602 Shortness of breath: Secondary | ICD-10-CM

## 2017-07-22 MED ORDER — PREDNISONE 10 MG (21) PO TBPK: ORAL_TABLET | ORAL | 0 refills | Status: AC

## 2017-07-22 NOTE — ED Triage Notes (Signed)
PT reports pain over left ribs. PT reports he has been seen before for this. PT reports pain with movement and with breathing. PT reports some exertional SOB. PT reports it feels as though something has collapsed, PT was treated for muscle strain, but has no relief from scripts.

## 2017-07-25 NOTE — ED Provider Notes (Signed)
  [facility]   [id number] 07/22/17 Arrival Time: 1519  ASSESSMENT & PLAN:  1. Chest wall pain   2. SOB (shortness of breath) on exertion   3. Costochondritis     Meds ordered this encounter  Medications  . predniSONE (STERAPRED UNI-PAK 21 TAB) 10 MG (21) TBPK tablet    Sig: Take as directed.    Dispense:  21 tablet    Refill:  0   Discussed costochondritis. Work note given for light duty. Will return in one week if not showing improvement.  ECG: Sinus bradycardia without acute changes.  Reviewed expectations re: course of current medical issues. Questions answered. Outlined signs and symptoms indicating need for more acute intervention. Patient verbalized understanding. After Visit Summary given.   SUBJECTIVE:  Alan Nixon is a 31 y.o. male who presents with complaint of lingering L-sided chest wall discomfort. Seen here on 06/17/2017 for similar. No injury or trauma. Gradual onset. Comes and goes. Seems worse with exertion and he questions if certain movements exacerbate at times. Describes as a sharp pain in anterior L chest without radiation. May feel the discomfort for a few minutes to an hour. Does move heavy objects at work regularly. Ibuprofen helps a little. He also describes feeling SOB with exertion but questions if that is because of the chest pain. Able to sleep through the night without much discomfort. Given Naproxen at last visit and he reports minimal help. Completed course. Non-smoker. Does not report SOB without the above symptoms. No associated n/v.  ROS: As per HPI.   OBJECTIVE:  Vitals:   07/22/17 1617 07/22/17 1619  BP: (!) 127/91   Pulse: 64   Resp: 16   Temp: 98.2 F (36.8 C)   TempSrc: Oral   SpO2: 100%   Weight:  130 lb (59 kg)  Height:  5\' 7"  (1.702 m)     General appearance: alert; no distress; muscular; healthy looking Neck: supple Lungs: clear to auscultation bilaterally Heart: regular rate and rhythm Chest wall: mild  discomfort reported to palpation of anterior L chest wall; no gross abnormalities Extremities: no cyanosis or edema; symmetrical with no gross deformities Skin: warm and dry Psychological: alert and cooperative; normal mood and affect       Mardella Layman, MD 07/25/17 1140

## 2020-12-08 ENCOUNTER — Other Ambulatory Visit: Payer: Self-pay

## 2020-12-08 ENCOUNTER — Encounter (HOSPITAL_COMMUNITY): Payer: Self-pay | Admitting: *Deleted

## 2020-12-08 ENCOUNTER — Emergency Department (HOSPITAL_COMMUNITY)
Admission: EM | Admit: 2020-12-08 | Discharge: 2020-12-08 | Disposition: A | Discharge status: Home / Self Care | Payer: Self-pay | Attending: Emergency Medicine | Admitting: Emergency Medicine

## 2020-12-08 ENCOUNTER — Emergency Department (HOSPITAL_COMMUNITY): Payer: Self-pay

## 2020-12-08 DIAGNOSIS — B353 Tinea pedis: Secondary | ICD-10-CM | POA: Insufficient documentation

## 2020-12-08 MED ORDER — ACETAMINOPHEN 325 MG PO TABS
650.0000 mg | ORAL_TABLET | Freq: Once | ORAL | Status: AC
  Administered 2020-12-08: 650 mg via ORAL
  Filled 2020-12-08: qty 2

## 2020-12-08 MED ORDER — CLOTRIMAZOLE 1 % EX CREA: TOPICAL_CREAM | CUTANEOUS | 0 refills | Status: AC

## 2020-12-08 NOTE — ED Notes (Signed)
Patient able to ambulate when discharged.

## 2020-12-08 NOTE — ED Triage Notes (Signed)
Reports 3 days of rt foot swelling. No injury noted. Painful to ambulated

## 2020-12-08 NOTE — ED Provider Notes (Signed)
[facility]-EMERGENCY DEPT Provider Note   CSN: [id number] Arrival date & time: 12/08/20  6834     History Chief Complaint  Patient presents with  . Foot Swelling    Alan Nixon is a 35 y.o. male with no pertinent past medical history presents the emergency department today for 3 days of right foot swelling.  Patient states that for the past 3 days he has noticed that his foot with swelling, was at its worst 2 days ago, has been progressively getting better. Is not swollen currently.  States that he has some pain to his midfoot.  Denies any trauma.  Denies any numbness and tingling.  Also noticed some white spots in between his toes.  Has been trying ibuprofen without relief.  States that he had to not go to work for the past couple of days due to pain upon ambulation.  States that yesterday he was feeling fine, at that time no fevers chills nausea vomiting.  No other complaints.  HPI     History reviewed. No pertinent past medical history.  There are no problems to display for this patient.   History reviewed. No pertinent surgical history.     No family history on file.  Social History   Tobacco Use  . Smoking status: Never Smoker  . Smokeless tobacco: Never Used  Substance Use Topics  . Alcohol use: Yes    Comment: occasionally  . Drug use: No    Home Medications Prior to Admission medications   Medication Sig Start Date End Date Taking? Authorizing Provider  clotrimazole (LOTRIMIN) 1 % cream Apply to affected area 2 times daily for 2 weeks or until infection is gone 12/08/20  Yes Farrel Gordon, PA-C  aspirin 325 MG tablet Take 325 mg by mouth daily as needed. For pain.    [provider]  diclofenac (CATAFLAM) 50 MG tablet Take 1 tablet (50 mg total) by mouth 3 (three) times daily. One tablet TID with food prn pain. 02/19/16   Hayden Rasmussen, NP  ondansetron (ZOFRAN ODT) 4 MG disintegrating tablet Take 1 tablet (4 mg total) by mouth every  8 (eight) hours as needed for nausea or vomiting. Patient not taking: Reported on 12/05/2015 11/16/15   Sam, Ace Gins, PA-C  predniSONE (STERAPRED UNI-PAK 21 TAB) 10 MG (21) TBPK tablet Take as directed. 07/22/17   Mardella Layman, MD  traMADol (ULTRAM) 50 MG tablet Take 1 tablet (50 mg total) by mouth every 6 (six) hours as needed. 02/19/16   Hayden Rasmussen, NP    Allergies    Patient has no known allergies.  Review of Systems   Review of Systems  Constitutional: Negative for diaphoresis, fatigue and fever.  Eyes: Negative for visual disturbance.  Respiratory: Negative for shortness of breath.   Cardiovascular: Negative for chest pain.  Gastrointestinal: Negative for nausea and vomiting.  Musculoskeletal: Positive for arthralgias. Negative for back pain, gait problem, myalgias and neck pain.  Skin: Negative for color change, pallor, rash and wound.  Neurological: Negative for syncope, weakness, light-headedness, numbness and headaches.  Psychiatric/Behavioral: Negative for behavioral problems and confusion.    Physical Exam Updated Vital Signs BP (!) 159/121 (BP Location: Right Arm)   Pulse 80   Temp 97.9 F (36.6 C) (Oral)   Resp 18   Ht 5\' 7"  (1.702 m)   Wt 61.2 kg   SpO2 100%   BMI 21.14 kg/m   Physical Exam Constitutional:      General: He is  not in acute distress.    Appearance: Normal appearance. He is not ill-appearing, toxic-appearing or diaphoretic.  Cardiovascular:     Rate and Rhythm: Normal rate and regular rhythm.     Pulses: Normal pulses.  Pulmonary:     Effort: Pulmonary effort is normal.     Breath sounds: Normal breath sounds.  Musculoskeletal:        General: Normal range of motion.       Feet:  Feet:     Comments: Patient with tenderness to midfoot as depicted above, there is some whitish discoloration in between toes, consistent with a yeast infection.  No swelling or edema appreciated.  No erythema or warmth to this area.  No calf pain.  Compartments  are soft.  Normal leg raise.  No pain elsewhere to foot.  PT pulses 2+.  Normal cap refill. Normal gait, nonantalgic.  Skin:    General: Skin is warm and dry.     Capillary Refill: Capillary refill takes less than 2 seconds.  Neurological:     General: No focal deficit present.     Mental Status: He is alert and oriented to person, place, and time.     Gait: Gait normal.  Psychiatric:        Mood and Affect: Mood normal.        Behavior: Behavior normal.        Thought Content: Thought content normal.     ED Results / Procedures / Treatments   Labs (all labs ordered are listed, but only abnormal results are displayed) Labs Reviewed - No data to display  EKG None  Radiology No results found.  Procedures Procedures (including critical care time)  Medications Ordered in ED Medications  acetaminophen (TYLENOL) tablet 650 mg (has no administration in time range)    ED Course  I have reviewed the triage vital signs and the nursing notes.  Pertinent labs & imaging results that were available during my care of the patient were reviewed by me and considered in my medical decision making (see chart for details).    MDM Rules/Calculators/A&P                          Alan Nixon is a 35 y.o. male with no pertinent past medical history presents the emergency department today for 3 days of right foot swelling.  On my exam foot is not swollen, patient does have some mild tenderness to palpation of mid foot, specifically next to toes.  No concerns for septic joint.  No concerns for cellulitis.  Patient is distally neurovascularly intact.  I think patient most likely has tinea pedis causing patient's symptoms.  Wells score 0 for DVT. Normal gait, will refer to podiatry for follow up if symptoms continue.    Doubt need for further emergent work up at this time. I explained the diagnosis and have given explicit precautions to return to the ER including for any other new or worsening  symptoms. The patient understands and accepts the medical plan as it's been dictated and I have answered their questions. Discharge instructions concerning home care and prescriptions have been given. The patient is STABLE and is discharged to home in good condition.   Final Clinical Impression(s) / ED Diagnoses Final diagnoses:  Tinea pedis of right foot    Rx / DC Orders ED Discharge Orders         Ordered    clotrimazole (LOTRIMIN) 1 %  cream        12/08/20 1828           Farrel Gordon, PA-C 12/08/20 2003    Pricilla Loveless, MD 12/10/20 [phone]

## 2020-12-08 NOTE — Discharge Instructions (Addendum)
You are seen today for athlete's foot, please you see attached instructions and use the medication as prescribed.  If you have any new worsening concerning symptoms please contact to the emergency department.  I wanted to follow-up with the PCP or podiatrist as directed.  Contact a doctor if: You have a fever. You have swelling, pain, warmth, or redness in your foot. Your feet are not getting better with treatment. Your symptoms get worse. You have new symptoms.
